# Patient Record
Sex: Female | Born: 1955
Health system: Southern US, Community
[De-identification: ages and names within clinical notes are randomized; demographics above are authoritative.]

---

## 2000-05-01 ENCOUNTER — Other Ambulatory Visit: Admission: RE | Admit: 2000-05-01 | Discharge: 2000-05-01 | Payer: Self-pay | Admitting: Obstetrics and Gynecology

## 2000-10-16 ENCOUNTER — Other Ambulatory Visit: Admission: RE | Admit: 2000-10-16 | Discharge: 2000-10-16 | Payer: Self-pay | Admitting: Obstetrics and Gynecology

## 2001-09-25 ENCOUNTER — Other Ambulatory Visit: Admission: RE | Admit: 2001-09-25 | Discharge: 2001-09-25 | Payer: Self-pay | Admitting: Obstetrics and Gynecology

## 2002-11-15 ENCOUNTER — Other Ambulatory Visit: Admission: RE | Admit: 2002-11-15 | Discharge: 2002-11-15 | Payer: Self-pay | Admitting: Obstetrics and Gynecology

## 2003-04-21 ENCOUNTER — Encounter: Payer: Self-pay | Admitting: Obstetrics and Gynecology

## 2003-04-21 ENCOUNTER — Encounter: Admission: RE | Admit: 2003-04-21 | Discharge: 2003-04-21 | Payer: Self-pay | Admitting: Obstetrics and Gynecology

## 2004-02-06 ENCOUNTER — Other Ambulatory Visit: Admission: RE | Admit: 2004-02-06 | Discharge: 2004-02-06 | Payer: Self-pay | Admitting: Obstetrics and Gynecology

## 2004-02-25 ENCOUNTER — Encounter: Admission: RE | Admit: 2004-02-25 | Discharge: 2004-02-25 | Payer: Self-pay | Admitting: Obstetrics and Gynecology

## 2004-10-18 ENCOUNTER — Encounter: Admission: RE | Admit: 2004-10-18 | Discharge: 2004-10-18 | Payer: Self-pay | Admitting: Family Medicine

## 2005-05-04 ENCOUNTER — Other Ambulatory Visit: Admission: RE | Admit: 2005-05-04 | Discharge: 2005-05-04 | Payer: Self-pay | Admitting: Obstetrics and Gynecology

## 2005-05-06 ENCOUNTER — Encounter: Admission: RE | Admit: 2005-05-06 | Discharge: 2005-05-06 | Payer: Self-pay | Admitting: Obstetrics and Gynecology

## 2006-05-08 ENCOUNTER — Encounter: Admission: RE | Admit: 2006-05-08 | Discharge: 2006-05-08 | Payer: Self-pay | Admitting: Obstetrics and Gynecology

## 2006-06-09 ENCOUNTER — Other Ambulatory Visit: Admission: RE | Admit: 2006-06-09 | Discharge: 2006-06-09 | Payer: Self-pay | Admitting: Obstetrics and Gynecology

## 2006-10-30 ENCOUNTER — Encounter: Admission: RE | Admit: 2006-10-30 | Discharge: 2006-10-30 | Payer: Self-pay | Admitting: Family Medicine

## 2007-05-10 ENCOUNTER — Encounter: Admission: RE | Admit: 2007-05-10 | Discharge: 2007-05-10 | Payer: Self-pay | Admitting: Obstetrics and Gynecology

## 2008-06-05 ENCOUNTER — Encounter: Admission: RE | Admit: 2008-06-05 | Discharge: 2008-06-05 | Payer: Self-pay | Admitting: Family Medicine

## 2009-06-11 ENCOUNTER — Encounter: Admission: RE | Admit: 2009-06-11 | Discharge: 2009-06-11 | Payer: Self-pay | Admitting: Obstetrics and Gynecology

## 2010-06-15 ENCOUNTER — Encounter: Payer: Self-pay | Admitting: Sports Medicine

## 2010-08-17 ENCOUNTER — Encounter: Admission: RE | Admit: 2010-08-17 | Discharge: 2010-08-17 | Payer: Self-pay | Admitting: Family Medicine

## 2011-01-25 NOTE — Consult Note (Signed)
Summary: [facility]   Imported By: Marily Memos 06/15/2010 12:05:14  _____________________________________________________________________  External Attachment:    Type:   Image     Comment:   External Document

## 2011-09-01 ENCOUNTER — Other Ambulatory Visit: Payer: Self-pay | Admitting: Family Medicine

## 2011-09-01 DIAGNOSIS — Z1231 Encounter for screening mammogram for malignant neoplasm of breast: Secondary | ICD-10-CM

## 2011-09-22 ENCOUNTER — Ambulatory Visit
Admission: RE | Admit: 2011-09-22 | Discharge: 2011-09-22 | Disposition: A | Discharge status: Home / Self Care | Payer: 59 | Source: Ambulatory Visit | Attending: Family Medicine | Admitting: Family Medicine

## 2011-09-22 DIAGNOSIS — Z1231 Encounter for screening mammogram for malignant neoplasm of breast: Secondary | ICD-10-CM

## 2012-02-01 ENCOUNTER — Other Ambulatory Visit: Payer: Self-pay | Admitting: Family Medicine

## 2012-02-01 ENCOUNTER — Other Ambulatory Visit (HOSPITAL_COMMUNITY)
Admission: RE | Admit: 2012-02-01 | Discharge: 2012-02-01 | Disposition: A | Discharge status: Home / Self Care | Payer: 59 | Source: Ambulatory Visit | Attending: Family Medicine | Admitting: Family Medicine

## 2012-02-01 DIAGNOSIS — Z124 Encounter for screening for malignant neoplasm of cervix: Secondary | ICD-10-CM | POA: Insufficient documentation

## 2012-09-19 ENCOUNTER — Other Ambulatory Visit: Payer: Self-pay | Admitting: Family Medicine

## 2012-09-19 DIAGNOSIS — Z1231 Encounter for screening mammogram for malignant neoplasm of breast: Secondary | ICD-10-CM

## 2012-10-08 ENCOUNTER — Ambulatory Visit
Admission: RE | Admit: 2012-10-08 | Discharge: 2012-10-08 | Disposition: A | Discharge status: Home / Self Care | Payer: 59 | Source: Ambulatory Visit | Attending: Family Medicine | Admitting: Family Medicine

## 2012-10-08 DIAGNOSIS — Z1231 Encounter for screening mammogram for malignant neoplasm of breast: Secondary | ICD-10-CM

## 2013-05-01 ENCOUNTER — Other Ambulatory Visit: Payer: Self-pay | Admitting: Family Medicine

## 2013-05-01 ENCOUNTER — Ambulatory Visit
Admission: RE | Admit: 2013-05-01 | Discharge: 2013-05-01 | Disposition: A | Discharge status: Home / Self Care | Payer: 59 | Source: Ambulatory Visit | Attending: Family Medicine | Admitting: Family Medicine

## 2013-05-01 DIAGNOSIS — R109 Unspecified abdominal pain: Secondary | ICD-10-CM

## 2013-05-03 ENCOUNTER — Other Ambulatory Visit: Payer: Self-pay | Admitting: Family Medicine

## 2013-05-03 ENCOUNTER — Ambulatory Visit
Admission: RE | Admit: 2013-05-03 | Discharge: 2013-05-03 | Disposition: A | Discharge status: Home / Self Care | Payer: 59 | Source: Ambulatory Visit | Attending: Family Medicine | Admitting: Family Medicine

## 2013-05-03 DIAGNOSIS — K859 Acute pancreatitis without necrosis or infection, unspecified: Secondary | ICD-10-CM

## 2013-05-03 MED ORDER — IOHEXOL 300 MG/ML  SOLN
100.0000 mL | Freq: Once | INTRAMUSCULAR | Status: AC | PRN
  Administered 2013-05-03: 100 mL via INTRAVENOUS

## 2013-06-10 ENCOUNTER — Other Ambulatory Visit: Payer: Self-pay | Admitting: Family Medicine

## 2013-06-10 DIAGNOSIS — R1012 Left upper quadrant pain: Secondary | ICD-10-CM

## 2013-07-04 ENCOUNTER — Ambulatory Visit
Admission: RE | Admit: 2013-07-04 | Discharge: 2013-07-04 | Disposition: A | Discharge status: Home / Self Care | Payer: 59 | Source: Ambulatory Visit | Attending: Family Medicine | Admitting: Family Medicine

## 2013-07-04 DIAGNOSIS — R1012 Left upper quadrant pain: Secondary | ICD-10-CM

## 2013-07-04 MED ORDER — IOHEXOL 300 MG/ML  SOLN
100.0000 mL | Freq: Once | INTRAMUSCULAR | Status: AC | PRN
  Administered 2013-07-04: 100 mL via INTRAVENOUS

## 2013-09-23 ENCOUNTER — Other Ambulatory Visit: Payer: Self-pay

## 2013-09-23 DIAGNOSIS — Z1231 Encounter for screening mammogram for malignant neoplasm of breast: Secondary | ICD-10-CM

## 2013-10-11 ENCOUNTER — Ambulatory Visit
Admission: RE | Admit: 2013-10-11 | Discharge: 2013-10-11 | Disposition: A | Discharge status: Home / Self Care | Payer: 59 | Source: Ambulatory Visit

## 2013-10-11 DIAGNOSIS — Z1231 Encounter for screening mammogram for malignant neoplasm of breast: Secondary | ICD-10-CM

## 2014-05-29 ENCOUNTER — Other Ambulatory Visit: Payer: Self-pay

## 2014-09-16 ENCOUNTER — Other Ambulatory Visit: Payer: Self-pay

## 2014-09-16 DIAGNOSIS — Z1231 Encounter for screening mammogram for malignant neoplasm of breast: Secondary | ICD-10-CM

## 2014-10-13 ENCOUNTER — Ambulatory Visit
Admission: RE | Admit: 2014-10-13 | Discharge: 2014-10-13 | Disposition: A | Discharge status: Home / Self Care | Payer: 59 | Source: Ambulatory Visit

## 2014-10-13 DIAGNOSIS — Z1231 Encounter for screening mammogram for malignant neoplasm of breast: Secondary | ICD-10-CM

## 2015-09-25 ENCOUNTER — Other Ambulatory Visit: Payer: Self-pay

## 2015-09-25 DIAGNOSIS — Z1231 Encounter for screening mammogram for malignant neoplasm of breast: Secondary | ICD-10-CM

## 2015-10-16 ENCOUNTER — Ambulatory Visit
Admission: RE | Admit: 2015-10-16 | Discharge: 2015-10-16 | Disposition: A | Discharge status: Home / Self Care | Payer: 59 | Source: Ambulatory Visit

## 2015-10-16 DIAGNOSIS — Z1231 Encounter for screening mammogram for malignant neoplasm of breast: Secondary | ICD-10-CM

## 2015-11-26 ENCOUNTER — Other Ambulatory Visit (HOSPITAL_COMMUNITY)
Admission: RE | Admit: 2015-11-26 | Discharge: 2015-11-26 | Disposition: A | Discharge status: Home / Self Care | Payer: 59 | Source: Ambulatory Visit | Attending: Family Medicine | Admitting: Family Medicine

## 2015-11-26 ENCOUNTER — Other Ambulatory Visit: Payer: Self-pay | Admitting: Family Medicine

## 2015-11-26 DIAGNOSIS — Z124 Encounter for screening for malignant neoplasm of cervix: Secondary | ICD-10-CM | POA: Diagnosis present

## 2015-11-26 DIAGNOSIS — Z1151 Encounter for screening for human papillomavirus (HPV): Secondary | ICD-10-CM | POA: Diagnosis not present

## 2015-11-27 LAB — CYTOLOGY - PAP

## 2016-09-16 ENCOUNTER — Other Ambulatory Visit: Payer: Self-pay | Admitting: Family Medicine

## 2016-09-16 DIAGNOSIS — Z1231 Encounter for screening mammogram for malignant neoplasm of breast: Secondary | ICD-10-CM

## 2016-10-21 ENCOUNTER — Ambulatory Visit: Payer: 59

## 2016-10-26 ENCOUNTER — Ambulatory Visit
Admission: RE | Admit: 2016-10-26 | Discharge: 2016-10-26 | Disposition: A | Discharge status: Home / Self Care | Payer: 59 | Source: Ambulatory Visit | Attending: Family Medicine | Admitting: Family Medicine

## 2016-10-26 DIAGNOSIS — Z1231 Encounter for screening mammogram for malignant neoplasm of breast: Secondary | ICD-10-CM

## 2017-01-09 DIAGNOSIS — M8588 Other specified disorders of bone density and structure, other site: Secondary | ICD-10-CM | POA: Diagnosis not present

## 2017-01-09 DIAGNOSIS — Z13820 Encounter for screening for osteoporosis: Secondary | ICD-10-CM | POA: Diagnosis not present

## 2017-01-26 DIAGNOSIS — Z1211 Encounter for screening for malignant neoplasm of colon: Secondary | ICD-10-CM | POA: Diagnosis not present

## 2017-01-26 DIAGNOSIS — D126 Benign neoplasm of colon, unspecified: Secondary | ICD-10-CM | POA: Diagnosis not present

## 2017-01-26 DIAGNOSIS — K635 Polyp of colon: Secondary | ICD-10-CM | POA: Diagnosis not present

## 2017-03-21 ENCOUNTER — Other Ambulatory Visit: Payer: Self-pay | Admitting: Family Medicine

## 2017-03-21 DIAGNOSIS — L989 Disorder of the skin and subcutaneous tissue, unspecified: Secondary | ICD-10-CM | POA: Diagnosis not present

## 2017-03-21 DIAGNOSIS — L820 Inflamed seborrheic keratosis: Secondary | ICD-10-CM | POA: Diagnosis not present

## 2017-08-21 DIAGNOSIS — L0390 Cellulitis, unspecified: Secondary | ICD-10-CM | POA: Diagnosis not present

## 2017-11-06 DIAGNOSIS — R413 Other amnesia: Secondary | ICD-10-CM | POA: Diagnosis not present

## 2017-11-06 DIAGNOSIS — L0390 Cellulitis, unspecified: Secondary | ICD-10-CM | POA: Diagnosis not present

## 2017-11-07 ENCOUNTER — Other Ambulatory Visit: Payer: Self-pay | Admitting: Physician Assistant

## 2017-11-07 DIAGNOSIS — R413 Other amnesia: Secondary | ICD-10-CM | POA: Diagnosis not present

## 2017-11-07 DIAGNOSIS — R51 Headache: Principal | ICD-10-CM

## 2017-11-07 DIAGNOSIS — R519 Headache, unspecified: Secondary | ICD-10-CM

## 2017-11-07 DIAGNOSIS — Z23 Encounter for immunization: Secondary | ICD-10-CM | POA: Diagnosis not present

## 2017-11-09 ENCOUNTER — Ambulatory Visit
Admission: RE | Admit: 2017-11-09 | Discharge: 2017-11-09 | Disposition: A | Discharge status: Home / Self Care | Payer: 59 | Source: Ambulatory Visit | Attending: Physician Assistant | Admitting: Physician Assistant

## 2017-11-09 DIAGNOSIS — R51 Headache: Principal | ICD-10-CM

## 2017-11-09 DIAGNOSIS — R519 Headache, unspecified: Secondary | ICD-10-CM

## 2017-12-27 ENCOUNTER — Other Ambulatory Visit: Payer: Self-pay | Admitting: Family Medicine

## 2017-12-27 DIAGNOSIS — Z1231 Encounter for screening mammogram for malignant neoplasm of breast: Secondary | ICD-10-CM

## 2018-01-17 ENCOUNTER — Ambulatory Visit: Payer: 59

## 2018-01-30 ENCOUNTER — Other Ambulatory Visit: Payer: Self-pay | Admitting: Family Medicine

## 2018-01-30 ENCOUNTER — Other Ambulatory Visit (HOSPITAL_COMMUNITY)
Admission: RE | Admit: 2018-01-30 | Discharge: 2018-01-30 | Disposition: A | Discharge status: Home / Self Care | Payer: 59 | Source: Ambulatory Visit | Attending: Family Medicine | Admitting: Family Medicine

## 2018-01-30 DIAGNOSIS — Z124 Encounter for screening for malignant neoplasm of cervix: Secondary | ICD-10-CM | POA: Insufficient documentation

## 2018-01-30 DIAGNOSIS — E782 Mixed hyperlipidemia: Secondary | ICD-10-CM | POA: Diagnosis not present

## 2018-01-30 DIAGNOSIS — E039 Hypothyroidism, unspecified: Secondary | ICD-10-CM | POA: Diagnosis not present

## 2018-01-30 DIAGNOSIS — Z0000 Encounter for general adult medical examination without abnormal findings: Secondary | ICD-10-CM | POA: Diagnosis not present

## 2018-02-01 LAB — CYTOLOGY - PAP
DIAGNOSIS: NEGATIVE
HPV (WINDOPATH): NOT DETECTED

## 2018-02-19 ENCOUNTER — Ambulatory Visit
Admission: RE | Admit: 2018-02-19 | Discharge: 2018-02-19 | Disposition: A | Discharge status: Home / Self Care | Payer: 59 | Source: Ambulatory Visit | Attending: Family Medicine | Admitting: Family Medicine

## 2018-02-19 DIAGNOSIS — Z1231 Encounter for screening mammogram for malignant neoplasm of breast: Secondary | ICD-10-CM | POA: Diagnosis not present

## 2018-03-30 DIAGNOSIS — E039 Hypothyroidism, unspecified: Secondary | ICD-10-CM | POA: Diagnosis not present

## 2018-08-20 DIAGNOSIS — J209 Acute bronchitis, unspecified: Secondary | ICD-10-CM | POA: Diagnosis not present

## 2018-08-20 DIAGNOSIS — J9801 Acute bronchospasm: Secondary | ICD-10-CM | POA: Diagnosis not present

## 2018-08-26 DIAGNOSIS — J209 Acute bronchitis, unspecified: Secondary | ICD-10-CM | POA: Diagnosis not present

## 2018-09-17 DIAGNOSIS — Z23 Encounter for immunization: Secondary | ICD-10-CM | POA: Diagnosis not present

## 2018-09-28 ENCOUNTER — Ambulatory Visit
Admission: RE | Admit: 2018-09-28 | Discharge: 2018-09-28 | Disposition: A | Discharge status: Home / Self Care | Payer: 59 | Source: Ambulatory Visit | Attending: Family Medicine | Admitting: Family Medicine

## 2018-09-28 ENCOUNTER — Other Ambulatory Visit: Payer: Self-pay | Admitting: Family Medicine

## 2018-09-28 DIAGNOSIS — J209 Acute bronchitis, unspecified: Secondary | ICD-10-CM | POA: Diagnosis not present

## 2018-09-28 DIAGNOSIS — J45909 Unspecified asthma, uncomplicated: Secondary | ICD-10-CM | POA: Diagnosis not present

## 2018-09-28 DIAGNOSIS — R05 Cough: Secondary | ICD-10-CM | POA: Diagnosis not present

## 2018-10-12 DIAGNOSIS — J45909 Unspecified asthma, uncomplicated: Secondary | ICD-10-CM | POA: Diagnosis not present

## 2019-01-25 ENCOUNTER — Other Ambulatory Visit: Payer: Self-pay | Admitting: Family Medicine

## 2019-01-25 DIAGNOSIS — Z1231 Encounter for screening mammogram for malignant neoplasm of breast: Secondary | ICD-10-CM

## 2019-02-21 ENCOUNTER — Ambulatory Visit: Payer: 59

## 2019-02-25 ENCOUNTER — Other Ambulatory Visit: Payer: Self-pay | Admitting: Family Medicine

## 2019-02-25 DIAGNOSIS — E782 Mixed hyperlipidemia: Secondary | ICD-10-CM | POA: Diagnosis not present

## 2019-02-25 DIAGNOSIS — Z0000 Encounter for general adult medical examination without abnormal findings: Secondary | ICD-10-CM | POA: Diagnosis not present

## 2019-02-25 DIAGNOSIS — Z23 Encounter for immunization: Secondary | ICD-10-CM | POA: Diagnosis not present

## 2019-02-25 DIAGNOSIS — F17210 Nicotine dependence, cigarettes, uncomplicated: Secondary | ICD-10-CM | POA: Diagnosis not present

## 2019-02-25 DIAGNOSIS — E039 Hypothyroidism, unspecified: Secondary | ICD-10-CM | POA: Diagnosis not present

## 2019-02-25 DIAGNOSIS — M8580 Other specified disorders of bone density and structure, unspecified site: Secondary | ICD-10-CM

## 2019-04-04 ENCOUNTER — Other Ambulatory Visit: Payer: Self-pay

## 2019-04-04 ENCOUNTER — Ambulatory Visit: Payer: Self-pay

## 2019-04-29 DIAGNOSIS — Z23 Encounter for immunization: Secondary | ICD-10-CM | POA: Diagnosis not present

## 2019-05-28 ENCOUNTER — Ambulatory Visit: Payer: Self-pay

## 2019-05-28 ENCOUNTER — Other Ambulatory Visit: Payer: Self-pay

## 2019-05-31 DIAGNOSIS — E7800 Pure hypercholesterolemia, unspecified: Secondary | ICD-10-CM | POA: Diagnosis not present

## 2019-05-31 DIAGNOSIS — S61210A Laceration without foreign body of right index finger without damage to nail, initial encounter: Secondary | ICD-10-CM | POA: Diagnosis not present

## 2019-05-31 DIAGNOSIS — S61214A Laceration without foreign body of right ring finger without damage to nail, initial encounter: Secondary | ICD-10-CM | POA: Diagnosis not present

## 2019-05-31 DIAGNOSIS — S67190A Crushing injury of right index finger, initial encounter: Secondary | ICD-10-CM | POA: Diagnosis not present

## 2019-05-31 DIAGNOSIS — E039 Hypothyroidism, unspecified: Secondary | ICD-10-CM | POA: Diagnosis not present

## 2019-05-31 DIAGNOSIS — F17210 Nicotine dependence, cigarettes, uncomplicated: Secondary | ICD-10-CM | POA: Diagnosis not present

## 2019-08-09 ENCOUNTER — Ambulatory Visit: Payer: Self-pay

## 2019-08-09 ENCOUNTER — Other Ambulatory Visit: Payer: Self-pay

## 2019-10-07 DIAGNOSIS — J0100 Acute maxillary sinusitis, unspecified: Secondary | ICD-10-CM | POA: Diagnosis not present

## 2019-10-19 DIAGNOSIS — J209 Acute bronchitis, unspecified: Secondary | ICD-10-CM | POA: Diagnosis not present

## 2020-01-08 ENCOUNTER — Other Ambulatory Visit: Payer: Self-pay | Admitting: Family Medicine

## 2020-01-08 DIAGNOSIS — M8580 Other specified disorders of bone density and structure, unspecified site: Secondary | ICD-10-CM

## 2020-01-08 DIAGNOSIS — Z1231 Encounter for screening mammogram for malignant neoplasm of breast: Secondary | ICD-10-CM

## 2020-03-06 ENCOUNTER — Ambulatory Visit
Admission: RE | Admit: 2020-03-06 | Discharge: 2020-03-06 | Disposition: A | Discharge status: Home / Self Care | Payer: BC Managed Care – PPO | Source: Ambulatory Visit | Attending: Family Medicine | Admitting: Family Medicine

## 2020-03-06 ENCOUNTER — Other Ambulatory Visit: Payer: Self-pay

## 2020-03-06 DIAGNOSIS — E039 Hypothyroidism, unspecified: Secondary | ICD-10-CM | POA: Diagnosis not present

## 2020-03-06 DIAGNOSIS — E782 Mixed hyperlipidemia: Secondary | ICD-10-CM | POA: Diagnosis not present

## 2020-03-06 DIAGNOSIS — Z1231 Encounter for screening mammogram for malignant neoplasm of breast: Secondary | ICD-10-CM | POA: Diagnosis not present

## 2020-03-06 DIAGNOSIS — Z780 Asymptomatic menopausal state: Secondary | ICD-10-CM | POA: Diagnosis not present

## 2020-03-06 DIAGNOSIS — M8580 Other specified disorders of bone density and structure, unspecified site: Secondary | ICD-10-CM

## 2020-03-06 DIAGNOSIS — Z0000 Encounter for general adult medical examination without abnormal findings: Secondary | ICD-10-CM | POA: Diagnosis not present

## 2020-03-06 DIAGNOSIS — M8589 Other specified disorders of bone density and structure, multiple sites: Secondary | ICD-10-CM | POA: Diagnosis not present

## 2020-03-06 DIAGNOSIS — F17200 Nicotine dependence, unspecified, uncomplicated: Secondary | ICD-10-CM | POA: Diagnosis not present

## 2020-03-19 ENCOUNTER — Telehealth: Payer: Self-pay | Admitting: *Deleted

## 2020-03-23 NOTE — Telephone Encounter (Signed)
Spoke with pt regarding lung cancer screening. Pt has decided she would like to wait until she goes on Medicare in 12/2020 to start. I advised pt that if she changes her mind and would like to start sooner to call me and we can get her set up. Pt verbalized understanding. Will defer pt's referral and call her back close to 12/2020.

## 2020-05-04 IMAGING — MG DIGITAL SCREENING BILAT W/ CAD
4 series · 4 of 4 positions shown · non-contrast
Comparison: Previous exam(s).

CLINICAL DATA: Screening.

EXAM:
DIGITAL SCREENING BILATERAL MAMMOGRAM WITH CAD

[L MLO]
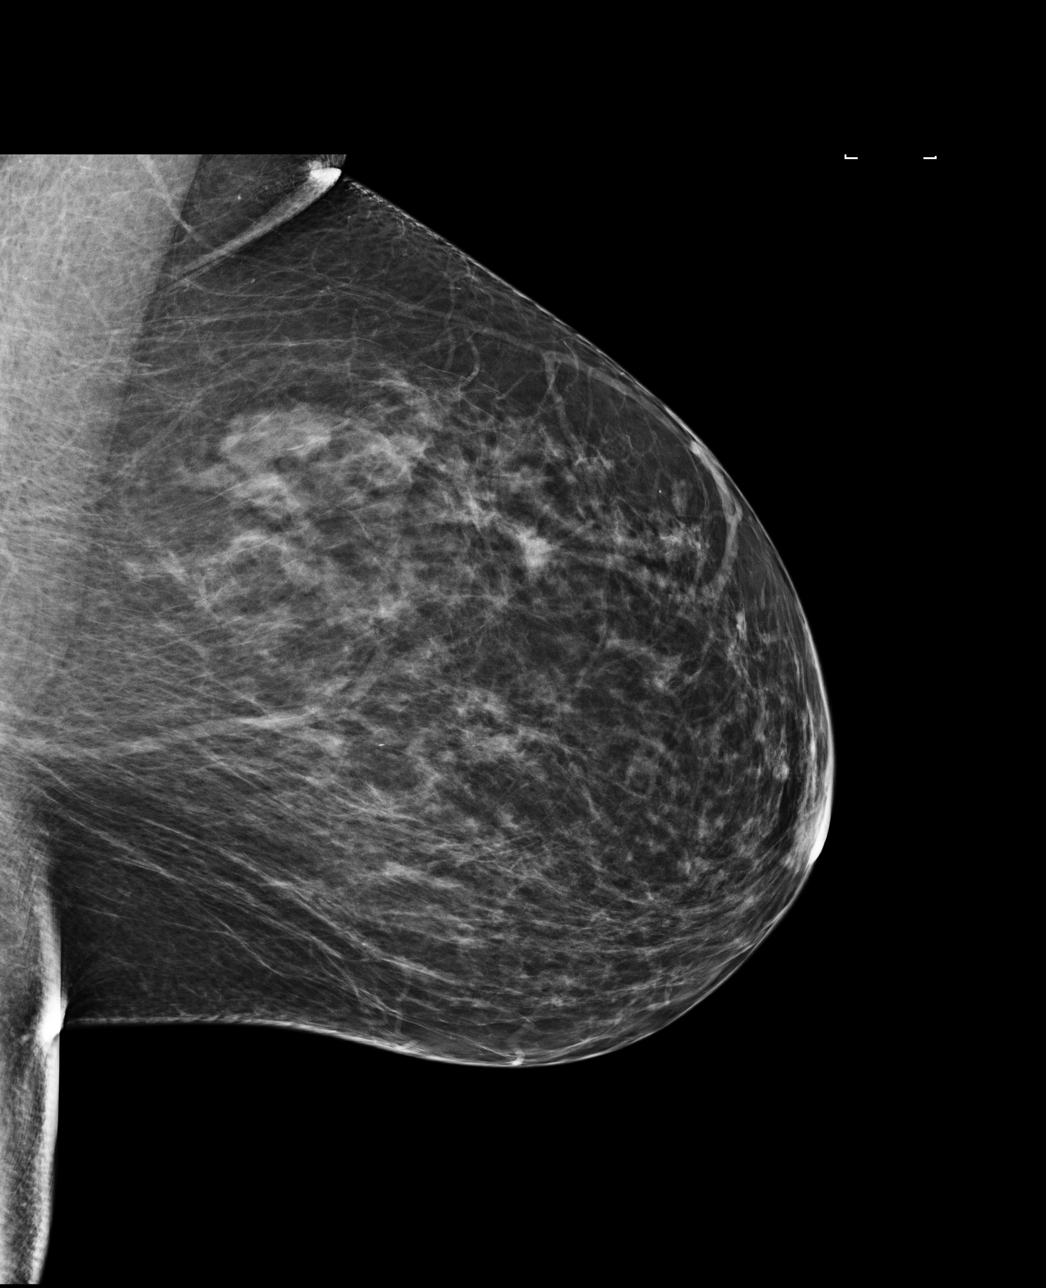

[R CC]
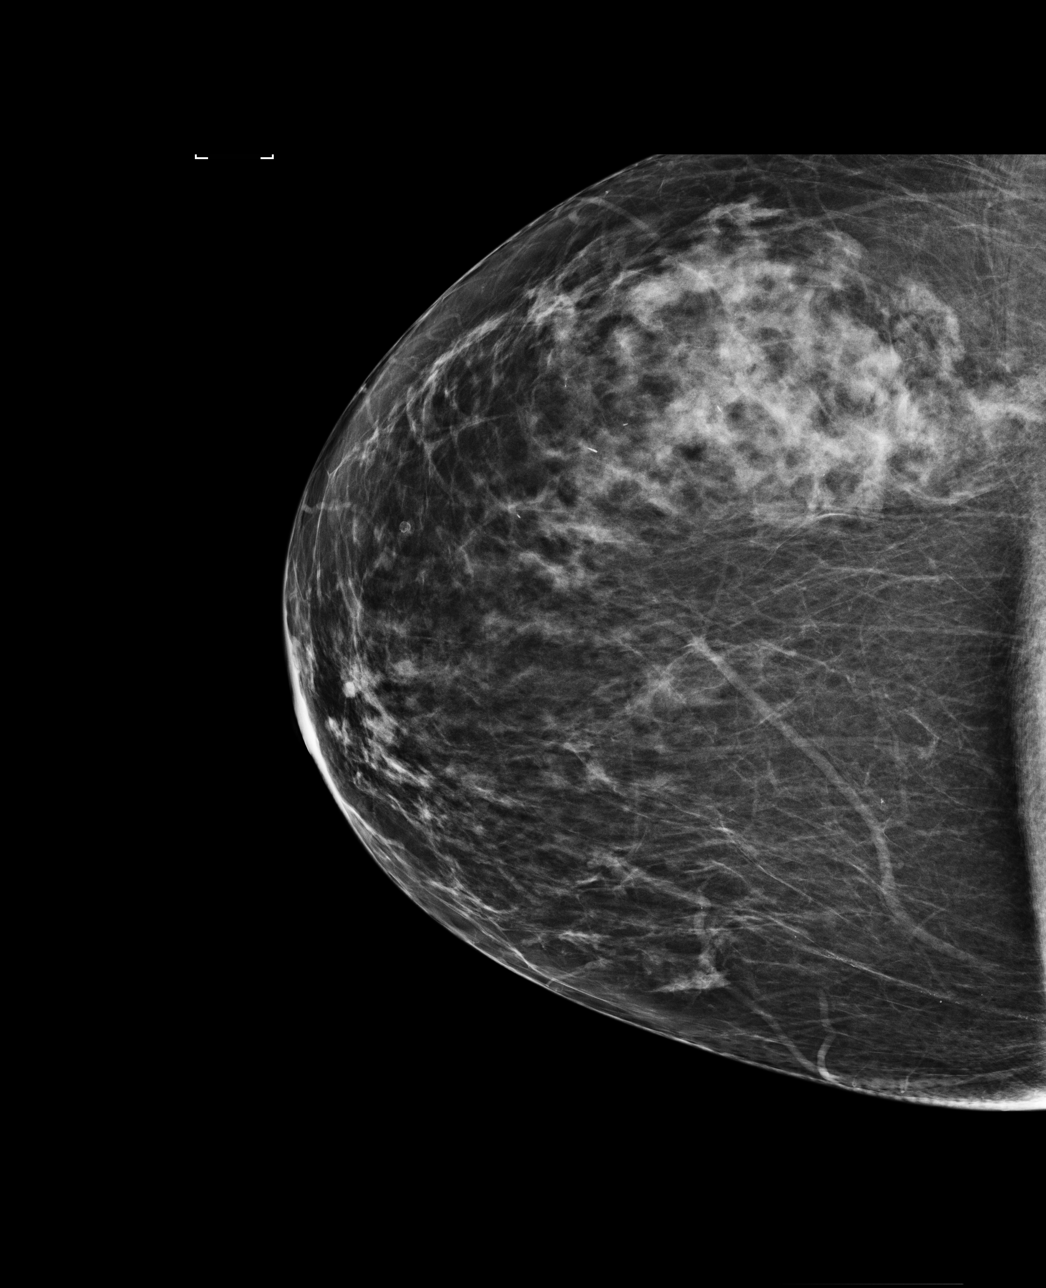

[L CC]
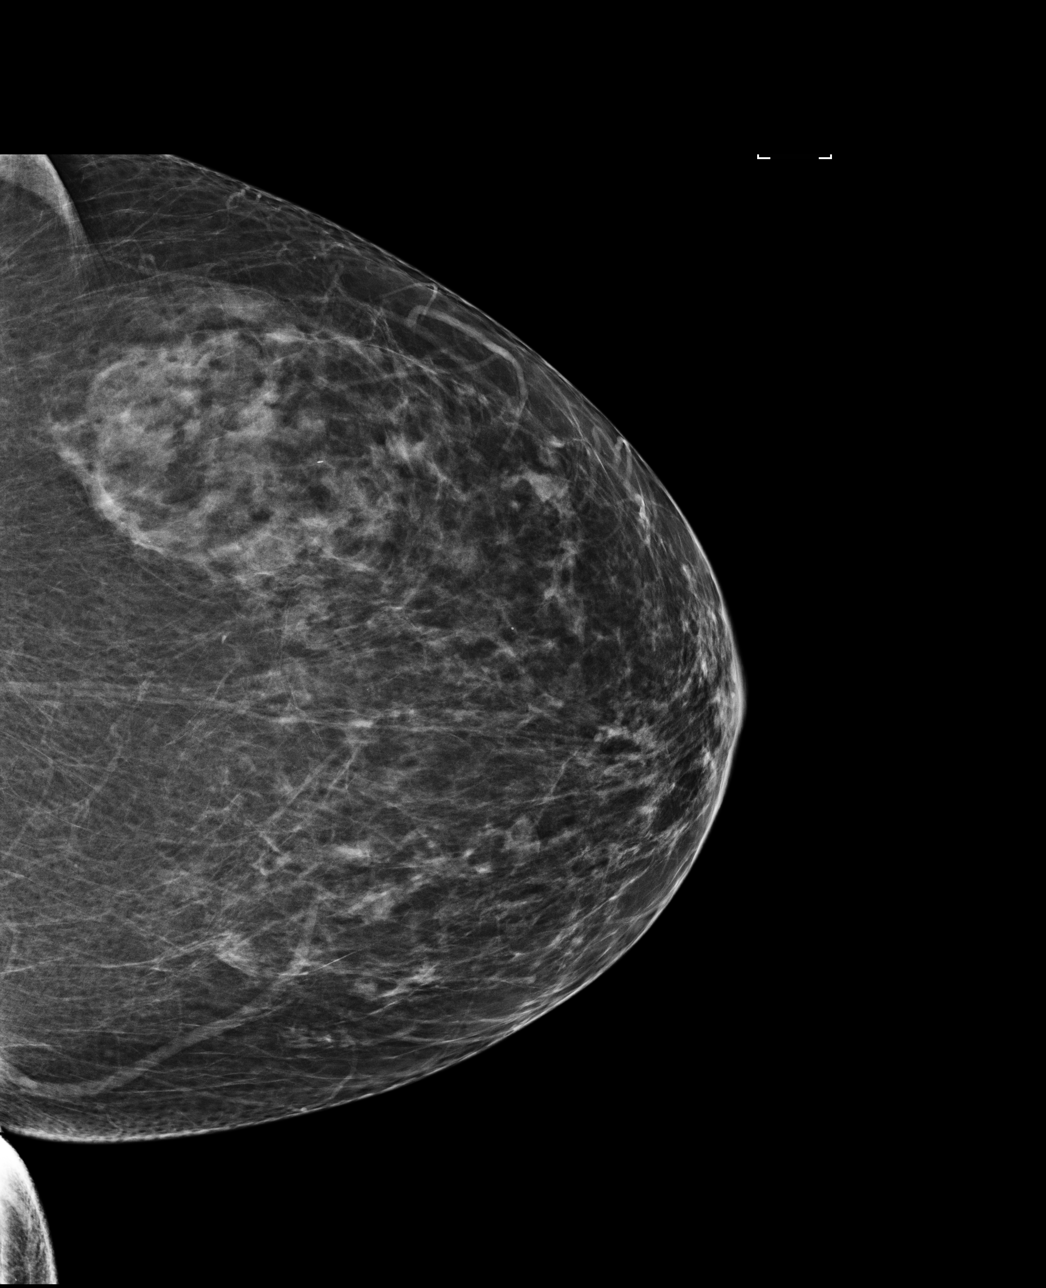

[R MLO]
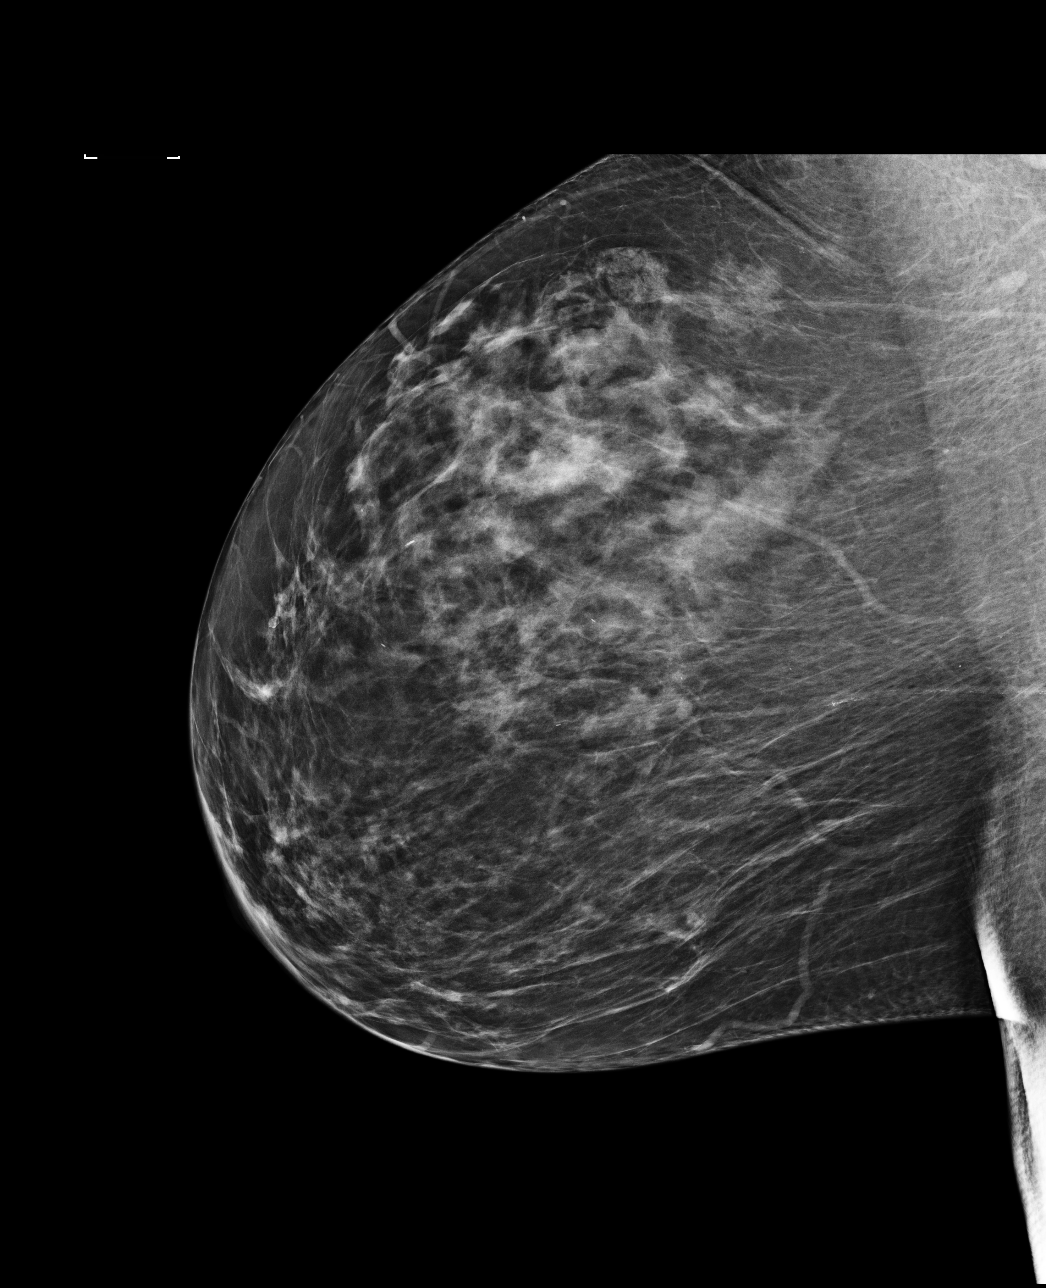

[4 of 4 positions shown; findings below may reference images not displayed]

ACR Breast Density Category c: The breast tissue is heterogeneously
dense, which may obscure small masses.
FINDINGS: There are no findings suspicious for malignancy. Images were
processed with CAD.
IMPRESSION: No mammographic evidence of malignancy. A result letter of this
screening mammogram will be mailed directly to the patient.

RECOMMENDATION:
Screening mammogram in one year. (Code:[id number])

BI-RADS CATEGORY  1: Negative.

## 2020-08-01 DIAGNOSIS — H10029 Other mucopurulent conjunctivitis, unspecified eye: Secondary | ICD-10-CM | POA: Diagnosis not present

## 2020-09-27 DIAGNOSIS — J189 Pneumonia, unspecified organism: Secondary | ICD-10-CM | POA: Diagnosis not present

## 2020-09-27 DIAGNOSIS — F17200 Nicotine dependence, unspecified, uncomplicated: Secondary | ICD-10-CM | POA: Diagnosis not present

## 2020-09-27 DIAGNOSIS — J449 Chronic obstructive pulmonary disease, unspecified: Secondary | ICD-10-CM | POA: Diagnosis not present

## 2020-09-27 DIAGNOSIS — Z20828 Contact with and (suspected) exposure to other viral communicable diseases: Secondary | ICD-10-CM | POA: Diagnosis not present

## 2021-03-10 ENCOUNTER — Other Ambulatory Visit: Payer: Self-pay | Admitting: *Deleted

## 2021-03-10 DIAGNOSIS — Z87891 Personal history of nicotine dependence: Secondary | ICD-10-CM

## 2021-03-10 DIAGNOSIS — F17210 Nicotine dependence, cigarettes, uncomplicated: Secondary | ICD-10-CM

## 2021-03-18 DIAGNOSIS — E782 Mixed hyperlipidemia: Secondary | ICD-10-CM | POA: Diagnosis not present

## 2021-03-18 DIAGNOSIS — E039 Hypothyroidism, unspecified: Secondary | ICD-10-CM | POA: Diagnosis not present

## 2021-03-18 DIAGNOSIS — Z1159 Encounter for screening for other viral diseases: Secondary | ICD-10-CM | POA: Diagnosis not present

## 2021-03-18 DIAGNOSIS — Z0000 Encounter for general adult medical examination without abnormal findings: Secondary | ICD-10-CM | POA: Diagnosis not present

## 2021-03-18 DIAGNOSIS — M8580 Other specified disorders of bone density and structure, unspecified site: Secondary | ICD-10-CM | POA: Diagnosis not present

## 2021-04-14 ENCOUNTER — Encounter: Payer: Self-pay | Admitting: Acute Care

## 2021-04-14 ENCOUNTER — Other Ambulatory Visit: Payer: Self-pay

## 2021-04-14 ENCOUNTER — Ambulatory Visit (INDEPENDENT_AMBULATORY_CARE_PROVIDER_SITE_OTHER): Payer: Medicare Other | Admitting: Acute Care

## 2021-04-14 DIAGNOSIS — F17210 Nicotine dependence, cigarettes, uncomplicated: Secondary | ICD-10-CM

## 2021-04-14 DIAGNOSIS — Z122 Encounter for screening for malignant neoplasm of respiratory organs: Secondary | ICD-10-CM | POA: Diagnosis not present

## 2021-04-14 NOTE — Patient Instructions (Signed)
Thank you for participating in the [facility] Scre[facility]. It was our pleasure to meet you today. We will call you with the results of your scan within the next few days. Your scan will be assigned a Lung RADS category score by the physicians reading the scans.  This Lung RADS score determines follow up scanning.  See below for description of categories, and follow up screening recommendations. We will be in touch to schedule your follow up screening annually or based on recommendations of our providers. We will fax a copy of your scan results to your Primary Care Physician, or the physician who referred you to the program, to ensure they have the results. Please call the office if you have any questions or concerns regarding your scanning experience or results.  Our office number is [phone]. Please speak with Denise Phelps, RN. She is our Lung Cancer Screening RN. If she is unavailable when you call, please have the office staff send her a message. She will return your call at her earliest convenience. Remember, if your scan is normal, we will scan you annually as long as you continue to meet the criteria for the program. (Age 55-77, Current smoker or smoker who has quit within the last 15 years). If you are a smoker, remember, quitting is the single most powerful action that you can take to decrease your risk of lung cancer and other pulmonary, breathing related problems. We know quitting is hard, and we are here to help.  Please let us know if there is anything we can do to help you meet your goal of quitting. If you are a former smoker, congratulations. We are proud of you! Remain smoke free! Remember you can refer friends or family members through the number above.  We will screen them to make sure they meet criteria for the program. Thank you for helping us take better care of you by participating in Lung Screening.  Lung RADS Categories:  Lung RADS 1: no nodules  or definitely non-concerning nodules.  Recommendation is for a repeat annual scan in 12 months.  Lung RADS 2:  nodules that are non-concerning in appearance and behavior with a very low likelihood of becoming an active cancer. Recommendation is for a repeat annual scan in 12 months.  Lung RADS 3: nodules that are probably non-concerning , includes nodules with a low likelihood of becoming an active cancer.  Recommendation is for a 6-month repeat screening scan. Often noted after an upper respiratory illness. We will be in touch to make sure you have no questions, and to schedule your 6-month scan.  Lung RADS 4 A: nodules with concerning findings, recommendation is most often for a follow up scan in 3 months or additional testing based on our provider's assessment of the scan. We will be in touch to make sure you have no questions and to schedule the recommended 3 month follow up scan.  Lung RADS 4 B:  indicates findings that are concerning. We will be in touch with you to schedule additional diagnostic testing based on our provider's  assessment of the scan.   

## 2021-04-14 NOTE — Progress Notes (Signed)
Virtual Visit via Video Note  I connected with Kimberly Jennings on 04/14/21 at 11:00 AM EDT by a video enabled telemedicine application and verified that I am speaking with the correct person using two identifiers.  Location: Patient: At home Provider: [place], [place], [place], [address], Pulmonary Critical Care Office   I discussed the limitations of evaluation and management by telemedicine and the availability of in person appointments. The patient expressed understanding and agreed to proceed.    Shared Decision Making Visit Lung Cancer Screening Program [phone])   Eligibility:  Age 65 y.o.  Pack Years Smoking History Calculation 36 pack year smoking history (# packs/per year x # years smoked)  Recent History of coughing up blood  no  Unexplained weight loss? no ( >Than 15 pounds within the last 6 months )  Prior History Lung / other cancer no (Diagnosis within the last 5 years already requiring surveillance chest CT Scans).  Smoking Status Current Smoker  Former Smokers: Years since quit: NA  Quit Date: NA  Visit Components:  Discussion included one or more decision making aids. yes  Discussion included risk/benefits of screening. yes  Discussion included potential follow up diagnostic testing for abnormal scans. yes  Discussion included meaning and risk of over diagnosis. yes  Discussion included meaning and risk of False Positives. yes  Discussion included meaning of total radiation exposure. yes  Counseling Included:  Importance of adherence to annual lung cancer LDCT screening. yes  Impact of comorbidities on ability to participate in the program. yes  Ability and willingness to under diagnostic treatment. yes  Smoking Cessation Counseling:  Current Smokers:   Discussed importance of smoking cessation. yes  Information about tobacco cessation classes and interventions provided to patient. yes  Patient provided with "ticket" for LDCT  Scan. yes  Symptomatic Patient. no  Counseling NA  Diagnosis Code: Tobacco Use Z72.0  Asymptomatic Patient yes  Counseling (Intermediate counseling: > three minutes counseling) Q7341  Former Smokers:   Discussed the importance of maintaining cigarette abstinence. yes  Diagnosis Code: Personal History of Nicotine Dependence. P37.902  Information about tobacco cessation classes and interventions provided to patient. Yes  Patient provided with "ticket" for LDCT Scan. yes  Written Order for Lung Cancer Screening with LDCT placed in Epic. Yes (CT Chest Lung Cancer Screening Low Dose W/O CM) [id number] Z12.2-Screening of respiratory organs Z87.891-Personal history of nicotine dependence  I have spent 25 minutes of face to face time with Ms. Kimberly Jennings discussing the risks and benefits of lung cancer screening. We viewed a power point together that explained in detail the above noted topics. We paused at intervals to allow for questions to be asked and answered to ensure understanding.We discussed that the single most powerful action that she can take to decrease her risk of developing lung cancer is to quit smoking. We discussed whether or not she is ready to commit to setting a quit date. We discussed options for tools to aid in quitting smoking including nicotine replacement therapy, non-nicotine medications, support groups, Quit Smart classes, and behavior modification. We discussed that often times setting smaller, more achievable goals, such as eliminating 1 cigarette a day for a week and then 2 cigarettes a day for a week can be helpful in slowly decreasing the number of cigarettes smoked. This allows for a sense of accomplishment as well as providing a clinical benefit. I gave her the " Be Stronger Than Your Excuses" card with contact information for community resources, classes,  free nicotine replacement therapy, and access to mobile apps, text messaging, and on-line smoking cessation help. I have  also given her my card and contact information in the event she needs to contact me. We discussed the time and location of the scan, and that either Doroteo Glassman RN or I will call with the results within 24-48 hours of receiving them. I have offered her  a copy of the power point we viewed  as a resource in the event they need reinforcement of the concepts we discussed today in the office. The patient verbalized understanding of all of  the above and had no further questions upon leaving the office. They have my contact information in the event they have any further questions.  I spent 4 minutes counseling on smoking cessation and the health risks of continued tobacco abuse.  I explained to the patient that there has been a high incidence of coronary artery disease noted on these exams. I explained that this is a non-gated exam therefore degree or severity cannot be determined. This patient is currently on statin therapy. I have asked the patient to follow-up with their PCP regarding any incidental finding of coronary artery disease and management with diet or medication as their PCP  feels is clinically indicated. The patient verbalized understanding of the above and had no further questions upon completion of the visit.    Magdalen Spatz, NP 04/14/2021

## 2021-04-21 ENCOUNTER — Telehealth: Payer: Self-pay | Admitting: Acute Care

## 2021-04-21 DIAGNOSIS — Z87891 Personal history of nicotine dependence: Secondary | ICD-10-CM

## 2021-04-21 DIAGNOSIS — F17210 Nicotine dependence, cigarettes, uncomplicated: Secondary | ICD-10-CM

## 2021-04-21 NOTE — Telephone Encounter (Signed)
Please call patient and let them  know their  low dose Ct was read as a. Lung RADS 2: nodules that are benign in appearance and behavior with a very low likelihood of becoming a clinically active cancer due to size or lack of growth. Recommendation per radiology is for a repeat LDCT in 12 months..Please let them  know we will order and schedule their  annual screening scan for 03/2022. Please let them  know there was notation of CAD on their  scan.  Please remind the patient  that this is a non-gated exam therefore degree or severity of disease  cannot be determined. Please have them  follow up with their PCP regarding potential risk factor modification, dietary therapy or pharmacologic therapy if clinically indicated. Pt.  is not  currently on statin therapy. Please place order for annual  screening scan for  03/2022 and fax results to PCP. Thanks so much.  Ask her to follow up with PCP for finding of age advanced coronary artery atherosclerosis.Thanks so much

## 2021-04-22 NOTE — Addendum Note (Signed)
Addended by: Doroteo Glassman D on: 04/22/2021 08:58 AM   Modules accepted: Orders

## 2021-04-22 NOTE — Telephone Encounter (Signed)
Pt informed of CT results per Sarah Groce, NP.  PT verbalized understanding.  Copy sent to PCP.  Order placed for 1 yr f/u CT.  

## 2021-04-26 ENCOUNTER — Other Ambulatory Visit: Payer: Self-pay | Admitting: Podiatry

## 2021-04-26 ENCOUNTER — Other Ambulatory Visit: Payer: Self-pay

## 2021-04-26 ENCOUNTER — Ambulatory Visit (INDEPENDENT_AMBULATORY_CARE_PROVIDER_SITE_OTHER): Payer: Medicare Other

## 2021-04-26 ENCOUNTER — Ambulatory Visit: Payer: Medicare Other | Admitting: Podiatry

## 2021-04-26 DIAGNOSIS — M2012 Hallux valgus (acquired), left foot: Secondary | ICD-10-CM | POA: Diagnosis not present

## 2021-04-26 DIAGNOSIS — F17200 Nicotine dependence, unspecified, uncomplicated: Secondary | ICD-10-CM | POA: Insufficient documentation

## 2021-04-26 DIAGNOSIS — M2042 Other hammer toe(s) (acquired), left foot: Secondary | ICD-10-CM | POA: Diagnosis not present

## 2021-04-26 DIAGNOSIS — M21612 Bunion of left foot: Secondary | ICD-10-CM

## 2021-04-26 DIAGNOSIS — D692 Other nonthrombocytopenic purpura: Secondary | ICD-10-CM | POA: Insufficient documentation

## 2021-04-26 DIAGNOSIS — M8580 Other specified disorders of bone density and structure, unspecified site: Secondary | ICD-10-CM | POA: Insufficient documentation

## 2021-04-26 DIAGNOSIS — M79671 Pain in right foot: Secondary | ICD-10-CM

## 2021-04-26 DIAGNOSIS — E039 Hypothyroidism, unspecified: Secondary | ICD-10-CM | POA: Insufficient documentation

## 2021-04-26 DIAGNOSIS — E669 Obesity, unspecified: Secondary | ICD-10-CM | POA: Insufficient documentation

## 2021-04-26 DIAGNOSIS — M79672 Pain in left foot: Secondary | ICD-10-CM

## 2021-04-26 DIAGNOSIS — K219 Gastro-esophageal reflux disease without esophagitis: Secondary | ICD-10-CM | POA: Insufficient documentation

## 2021-04-26 DIAGNOSIS — J45909 Unspecified asthma, uncomplicated: Secondary | ICD-10-CM | POA: Insufficient documentation

## 2021-04-26 DIAGNOSIS — E7800 Pure hypercholesterolemia, unspecified: Secondary | ICD-10-CM | POA: Insufficient documentation

## 2021-04-26 DIAGNOSIS — R413 Other amnesia: Secondary | ICD-10-CM | POA: Insufficient documentation

## 2021-04-26 DIAGNOSIS — M7752 Other enthesopathy of left foot: Secondary | ICD-10-CM

## 2021-04-26 DIAGNOSIS — J301 Allergic rhinitis due to pollen: Secondary | ICD-10-CM | POA: Insufficient documentation

## 2021-04-26 NOTE — Progress Notes (Signed)
  Subjective:  Patient ID: Kimberly Jennings, female    DOB: June 05, 1956,  MRN: [id number]  Chief Complaint  Patient presents with  . Foot Problem    Lesions at LT hallux lateral side and 2nd to medial side x years -toes rubbs each other -Tx: cotton in between and corn pads -worse with shoes     65 y.o. female presents with the above complaint. History confirmed with patient.   Objective:  Physical Exam: warm, good capillary refill, no trophic changes or ulcerative lesions, normal DP and PT pulses and normal sensory exam. Left Foot: bunion deformity noted and hammertoes, with 2nd PIPJ corn medially, mild HPK lateral hallux. HPK submet 4 . POP about the 1st and 2nd toes and 4th met area.   No images are attached to the encounter.  Radiographs: X-ray of the left foot: hallux valgus deformity and digital contractures mild 1st MPJ arthritic changes, prominent bone medially. Assessment:   1. Hallux valgus with bunions, left   2. Hammer toe of left foot   3. Capsulitis of metatarsophalangeal (MTP) joint of left foot      Plan:  Patient was evaluated and treated and all questions answered.  Bunion and Hammertoe -XR reviewed with patient -Educated on etiology of deformity -Discussed proper shoe gear modifications and padding -Dispensed toe spacers  -Lesions courtesy debrided. -Should issues persist consider MIS bunionectomy, 2nd HT correction, possible 4th met ostoetomy.  Return in about 4 weeks (around 05/24/2021) for Left bunion, hammertoe f/u; possible surgical planning visit.

## 2021-06-07 ENCOUNTER — Ambulatory Visit: Payer: Medicare Other | Admitting: Podiatry

## 2021-10-14 ENCOUNTER — Other Ambulatory Visit: Payer: Self-pay

## 2021-10-14 ENCOUNTER — Ambulatory Visit: Payer: Medicare Other | Admitting: Podiatry

## 2021-10-14 DIAGNOSIS — M7752 Other enthesopathy of left foot: Secondary | ICD-10-CM | POA: Diagnosis not present

## 2021-10-14 DIAGNOSIS — M2042 Other hammer toe(s) (acquired), left foot: Secondary | ICD-10-CM | POA: Diagnosis not present

## 2021-10-14 DIAGNOSIS — M2012 Hallux valgus (acquired), left foot: Secondary | ICD-10-CM

## 2021-10-14 DIAGNOSIS — M21612 Bunion of left foot: Secondary | ICD-10-CM

## 2021-10-21 NOTE — Progress Notes (Signed)
  Subjective:  Patient ID: Kimberly Jennings, female    DOB: 15-Oct-1956,  MRN: [id number]  No chief complaint on file.   65 y.o. female presents with the above complaint. History confirmed with patient.  States that the left big toe and left second toe are continuing to hurt and rub and she cannot wear normal shoes.  She would like to discuss possible surgical intervention for this issue.  Objective:  Physical Exam: warm, good capillary refill, no trophic changes or ulcerative lesions, normal DP and PT pulses and normal sensory exam. Left Foot: bunion deformity noted and hammertoes, with 2nd PIPJ corn medially, mild HPK lateral hallux. HPK submet 4  . POP about the 1st and 2nd toes and 4th met area.   No images are attached to the encounter.  Radiographs: X-ray of the left foot: hallux valgus deformity and digital contractures mild 1st MPJ arthritic changes, prominent bone medially. Assessment:   1. Hallux valgus with bunions, left   2. Hammer toe of left foot   3. Capsulitis of metatarsophalangeal (MTP) joint of left foot    Plan:  Patient was evaluated and treated and all questions answered.  Bunion and Hammertoe -We discussed continued conservative care vs surgical intervention. Patient would like to proceed with intervention -Patient has failed conservative therapy and wishes to proceed with surgical intervention. All risks, benefits, and alternatives discussed with patient. No guarantees given. Consent reviewed and signed by patient. -Planned procedures: Left foot correction of bunion with double osteotomy including Akin, 2nd hammertoe correction 4th met osteotomy  -Should issues persist consider bunionectomy, 2nd HT correction, possible 4th met ostoetomy.  No follow-ups on file.

## 2021-11-08 ENCOUNTER — Telehealth: Payer: Self-pay

## 2021-11-08 NOTE — Telephone Encounter (Signed)
Kimberly Jennings called to cancel her surgery with Dr. March Rummage on 12/29/2021. She stated her mother fell and broke her hip. She will have to go through rehab and [name] is not sure how long she will need to take care of her mom. She will call me back to reschedule later. Notified Dr. March Rummage and left a message for Caren Griffins at [facility]

## 2021-11-08 NOTE — Telephone Encounter (Signed)
Noted thank you

## 2021-11-29 DIAGNOSIS — H02055 Trichiasis without entropian left lower eyelid: Secondary | ICD-10-CM | POA: Diagnosis not present

## 2021-11-29 DIAGNOSIS — H2513 Age-related nuclear cataract, bilateral: Secondary | ICD-10-CM | POA: Diagnosis not present

## 2021-11-29 DIAGNOSIS — H5213 Myopia, bilateral: Secondary | ICD-10-CM | POA: Diagnosis not present

## 2021-11-29 DIAGNOSIS — H524 Presbyopia: Secondary | ICD-10-CM | POA: Diagnosis not present

## 2022-01-06 ENCOUNTER — Encounter: Payer: Medicare Other | Admitting: Podiatry

## 2022-01-20 ENCOUNTER — Encounter: Payer: Medicare Other | Admitting: Podiatry

## 2022-03-31 DIAGNOSIS — E039 Hypothyroidism, unspecified: Secondary | ICD-10-CM | POA: Diagnosis not present

## 2022-03-31 DIAGNOSIS — M8580 Other specified disorders of bone density and structure, unspecified site: Secondary | ICD-10-CM | POA: Diagnosis not present

## 2022-03-31 DIAGNOSIS — E782 Mixed hyperlipidemia: Secondary | ICD-10-CM | POA: Diagnosis not present

## 2022-03-31 DIAGNOSIS — Z0000 Encounter for general adult medical examination without abnormal findings: Secondary | ICD-10-CM | POA: Diagnosis not present

## 2022-04-08 ENCOUNTER — Encounter: Payer: Self-pay | Admitting: Acute Care

## 2022-04-25 DIAGNOSIS — Z87891 Personal history of nicotine dependence: Secondary | ICD-10-CM | POA: Diagnosis not present

## 2022-04-25 DIAGNOSIS — Z122 Encounter for screening for malignant neoplasm of respiratory organs: Secondary | ICD-10-CM | POA: Diagnosis not present

## 2022-04-25 DIAGNOSIS — F17210 Nicotine dependence, cigarettes, uncomplicated: Secondary | ICD-10-CM | POA: Diagnosis not present

## 2022-04-27 ENCOUNTER — Telehealth: Payer: Self-pay | Admitting: Acute Care

## 2022-04-27 DIAGNOSIS — Z122 Encounter for screening for malignant neoplasm of respiratory organs: Secondary | ICD-10-CM

## 2022-04-27 DIAGNOSIS — F17210 Nicotine dependence, cigarettes, uncomplicated: Secondary | ICD-10-CM

## 2022-04-27 DIAGNOSIS — Z87891 Personal history of nicotine dependence: Secondary | ICD-10-CM

## 2022-04-27 NOTE — Telephone Encounter (Signed)
Received faxed report of Lung Screening CT scan done on 04/25/22 at [facility].  ?Called Pt and informed of CT results per Eric Form, NP.  PT verbalized understanding.  Copy of CT sent to PCP.  Order placed for 1 yr f/u CT. ? ?

## 2022-06-21 DIAGNOSIS — E039 Hypothyroidism, unspecified: Secondary | ICD-10-CM | POA: Diagnosis not present

## 2022-06-24 DIAGNOSIS — M8589 Other specified disorders of bone density and structure, multiple sites: Secondary | ICD-10-CM | POA: Diagnosis not present

## 2022-06-24 DIAGNOSIS — Z1231 Encounter for screening mammogram for malignant neoplasm of breast: Secondary | ICD-10-CM | POA: Diagnosis not present

## 2022-12-14 DIAGNOSIS — R051 Acute cough: Secondary | ICD-10-CM | POA: Diagnosis not present

## 2022-12-14 DIAGNOSIS — J209 Acute bronchitis, unspecified: Secondary | ICD-10-CM | POA: Diagnosis not present

## 2022-12-14 DIAGNOSIS — R0981 Nasal congestion: Secondary | ICD-10-CM | POA: Diagnosis not present

## 2022-12-14 DIAGNOSIS — J324 Chronic pansinusitis: Secondary | ICD-10-CM | POA: Diagnosis not present

## 2023-04-12 DIAGNOSIS — I700 Atherosclerosis of aorta: Secondary | ICD-10-CM | POA: Diagnosis not present

## 2023-04-12 DIAGNOSIS — E039 Hypothyroidism, unspecified: Secondary | ICD-10-CM | POA: Diagnosis not present

## 2023-04-12 DIAGNOSIS — E782 Mixed hyperlipidemia: Secondary | ICD-10-CM | POA: Diagnosis not present

## 2023-04-12 DIAGNOSIS — D692 Other nonthrombocytopenic purpura: Secondary | ICD-10-CM | POA: Diagnosis not present

## 2023-04-12 DIAGNOSIS — Z0000 Encounter for general adult medical examination without abnormal findings: Secondary | ICD-10-CM | POA: Diagnosis not present

## 2023-04-12 DIAGNOSIS — F17200 Nicotine dependence, unspecified, uncomplicated: Secondary | ICD-10-CM | POA: Diagnosis not present

## 2023-04-12 DIAGNOSIS — M8580 Other specified disorders of bone density and structure, unspecified site: Secondary | ICD-10-CM | POA: Diagnosis not present

## 2023-04-18 DIAGNOSIS — M158 Other polyosteoarthritis: Secondary | ICD-10-CM | POA: Diagnosis not present

## 2023-04-27 DIAGNOSIS — J432 Centrilobular emphysema: Secondary | ICD-10-CM | POA: Diagnosis not present

## 2023-04-27 DIAGNOSIS — F17210 Nicotine dependence, cigarettes, uncomplicated: Secondary | ICD-10-CM | POA: Diagnosis not present

## 2023-04-27 DIAGNOSIS — I700 Atherosclerosis of aorta: Secondary | ICD-10-CM | POA: Diagnosis not present

## 2023-04-27 DIAGNOSIS — Z122 Encounter for screening for malignant neoplasm of respiratory organs: Secondary | ICD-10-CM | POA: Diagnosis not present

## 2023-04-27 DIAGNOSIS — I2510 Atherosclerotic heart disease of native coronary artery without angina pectoris: Secondary | ICD-10-CM | POA: Diagnosis not present

## 2023-05-08 ENCOUNTER — Telehealth: Payer: Self-pay | Admitting: *Deleted

## 2023-05-08 DIAGNOSIS — F17210 Nicotine dependence, cigarettes, uncomplicated: Secondary | ICD-10-CM

## 2023-05-08 DIAGNOSIS — Z122 Encounter for screening for malignant neoplasm of respiratory organs: Secondary | ICD-10-CM

## 2023-05-08 DIAGNOSIS — Z87891 Personal history of nicotine dependence: Secondary | ICD-10-CM

## 2023-05-08 NOTE — Telephone Encounter (Signed)
Updated patient that we will give her a call tomorrow when results available and reviewed.  Results will be faxed to [place] and we will call her as soon as possible.  Patient acknowledged.

## 2023-05-08 NOTE — Telephone Encounter (Signed)
Calling on lung scan results. Please call her @ [phone]. TY.

## 2023-05-09 NOTE — Telephone Encounter (Signed)
Lung screening CT from 04/27/2023 reviewed in PACS system . Read as a lung RADS 2 .  Spoke with pt and reviewed CT results. Stable lung nodules that will continue to be monitored at yearly scan. Emphysema seen related to smoking history. Coronary calcifications seen. Pt is on statin daily. CT results sent to PCP. Order placed for 1 yr lung screening CT.

## 2023-05-09 NOTE — Addendum Note (Signed)
Addended by: Abigail Miyamoto D on: 05/09/2023 10:02 AM   Modules accepted: Orders

## 2023-05-26 DIAGNOSIS — J9801 Acute bronchospasm: Secondary | ICD-10-CM | POA: Diagnosis not present

## 2023-05-26 DIAGNOSIS — J45909 Unspecified asthma, uncomplicated: Secondary | ICD-10-CM | POA: Diagnosis not present

## 2023-06-20 DIAGNOSIS — J45909 Unspecified asthma, uncomplicated: Secondary | ICD-10-CM | POA: Diagnosis not present

## 2023-07-03 DIAGNOSIS — K080 Exfoliation of teeth due to systemic causes: Secondary | ICD-10-CM | POA: Diagnosis not present

## 2023-07-17 DIAGNOSIS — Z01818 Encounter for other preprocedural examination: Secondary | ICD-10-CM | POA: Diagnosis not present

## 2023-07-17 DIAGNOSIS — Z86010 Personal history of colonic polyps: Secondary | ICD-10-CM | POA: Diagnosis not present

## 2023-08-07 DIAGNOSIS — J439 Emphysema, unspecified: Secondary | ICD-10-CM | POA: Diagnosis not present

## 2023-08-07 DIAGNOSIS — F17200 Nicotine dependence, unspecified, uncomplicated: Secondary | ICD-10-CM | POA: Diagnosis not present

## 2023-08-23 DIAGNOSIS — H524 Presbyopia: Secondary | ICD-10-CM | POA: Diagnosis not present

## 2023-08-29 DIAGNOSIS — T148XXA Other injury of unspecified body region, initial encounter: Secondary | ICD-10-CM | POA: Diagnosis not present

## 2023-08-29 DIAGNOSIS — L089 Local infection of the skin and subcutaneous tissue, unspecified: Secondary | ICD-10-CM | POA: Diagnosis not present

## 2023-09-01 DIAGNOSIS — Z09 Encounter for follow-up examination after completed treatment for conditions other than malignant neoplasm: Secondary | ICD-10-CM | POA: Diagnosis not present

## 2023-09-01 DIAGNOSIS — Z1211 Encounter for screening for malignant neoplasm of colon: Secondary | ICD-10-CM | POA: Diagnosis not present

## 2023-09-01 DIAGNOSIS — Z86010 Personal history of colonic polyps: Secondary | ICD-10-CM | POA: Diagnosis not present

## 2023-09-06 DIAGNOSIS — S81802D Unspecified open wound, left lower leg, subsequent encounter: Secondary | ICD-10-CM | POA: Diagnosis not present

## 2023-10-31 DIAGNOSIS — S63632A Sprain of interphalangeal joint of right middle finger, initial encounter: Secondary | ICD-10-CM | POA: Diagnosis not present

## 2023-11-20 DIAGNOSIS — R519 Headache, unspecified: Secondary | ICD-10-CM | POA: Diagnosis not present

## 2023-11-20 DIAGNOSIS — H9209 Otalgia, unspecified ear: Secondary | ICD-10-CM | POA: Diagnosis not present

## 2023-11-20 DIAGNOSIS — R0981 Nasal congestion: Secondary | ICD-10-CM | POA: Diagnosis not present

## 2023-12-12 DIAGNOSIS — M20011 Mallet finger of right finger(s): Secondary | ICD-10-CM | POA: Diagnosis not present

## 2023-12-29 DIAGNOSIS — J439 Emphysema, unspecified: Secondary | ICD-10-CM | POA: Diagnosis not present

## 2023-12-29 DIAGNOSIS — J441 Chronic obstructive pulmonary disease with (acute) exacerbation: Secondary | ICD-10-CM | POA: Diagnosis not present

## 2023-12-29 DIAGNOSIS — R053 Chronic cough: Secondary | ICD-10-CM | POA: Diagnosis not present

## 2023-12-29 DIAGNOSIS — Z20828 Contact with and (suspected) exposure to other viral communicable diseases: Secondary | ICD-10-CM | POA: Diagnosis not present

## 2024-01-08 DIAGNOSIS — J441 Chronic obstructive pulmonary disease with (acute) exacerbation: Secondary | ICD-10-CM | POA: Diagnosis not present

## 2024-01-26 DIAGNOSIS — S81812A Laceration without foreign body, left lower leg, initial encounter: Secondary | ICD-10-CM | POA: Diagnosis not present

## 2024-01-26 DIAGNOSIS — Z23 Encounter for immunization: Secondary | ICD-10-CM | POA: Diagnosis not present

## 2024-01-26 DIAGNOSIS — W19XXXA Unspecified fall, initial encounter: Secondary | ICD-10-CM | POA: Diagnosis not present

## 2024-01-29 ENCOUNTER — Ambulatory Visit (HOSPITAL_COMMUNITY)
Admission: RE | Admit: 2024-01-29 | Discharge: 2024-01-29 | Disposition: A | Discharge status: Home / Self Care | Payer: Medicare Other | Source: Ambulatory Visit | Attending: Family Medicine | Admitting: Family Medicine

## 2024-01-29 ENCOUNTER — Other Ambulatory Visit (HOSPITAL_COMMUNITY): Payer: Self-pay | Admitting: Family Medicine

## 2024-01-29 DIAGNOSIS — S8990XD Unspecified injury of unspecified lower leg, subsequent encounter: Secondary | ICD-10-CM | POA: Diagnosis not present

## 2024-01-29 DIAGNOSIS — M7989 Other specified soft tissue disorders: Secondary | ICD-10-CM

## 2024-01-29 DIAGNOSIS — S81802D Unspecified open wound, left lower leg, subsequent encounter: Secondary | ICD-10-CM | POA: Diagnosis not present

## 2024-01-29 DIAGNOSIS — S81812D Laceration without foreign body, left lower leg, subsequent encounter: Secondary | ICD-10-CM | POA: Diagnosis not present

## 2024-01-29 DIAGNOSIS — F17200 Nicotine dependence, unspecified, uncomplicated: Secondary | ICD-10-CM | POA: Diagnosis not present

## 2024-01-29 NOTE — Progress Notes (Signed)
Lower extremity venous duplex completed. Please see CV Procedures for preliminary results.  Shona Simpson, RVT 01/29/24 3:29 PM

## 2024-02-20 DIAGNOSIS — I2510 Atherosclerotic heart disease of native coronary artery without angina pectoris: Secondary | ICD-10-CM | POA: Diagnosis not present

## 2024-02-20 DIAGNOSIS — Z1330 Encounter for screening examination for mental health and behavioral disorders, unspecified: Secondary | ICD-10-CM | POA: Diagnosis not present

## 2024-02-20 DIAGNOSIS — S81812A Laceration without foreign body, left lower leg, initial encounter: Secondary | ICD-10-CM | POA: Diagnosis not present

## 2024-02-22 ENCOUNTER — Telehealth: Payer: Self-pay | Admitting: Acute Care

## 2024-02-22 NOTE — Telephone Encounter (Signed)
 Patient needs CT information to be sent to her PCP Kimberly Jennings . Patient can be reached at [phone]

## 2024-02-22 NOTE — Telephone Encounter (Signed)
 Returned call. Annual scan scheduled for 05/07/2024 at [facility] at 8:30am. In the past patient has completed scans at [facility] and results viewed in PACS. Message sent to update Dr. Clelia Croft on annual CT.

## 2024-02-29 DIAGNOSIS — I2510 Atherosclerotic heart disease of native coronary artery without angina pectoris: Secondary | ICD-10-CM | POA: Diagnosis not present

## 2024-02-29 DIAGNOSIS — S81812A Laceration without foreign body, left lower leg, initial encounter: Secondary | ICD-10-CM | POA: Diagnosis not present

## 2024-02-29 DIAGNOSIS — S81812D Laceration without foreign body, left lower leg, subsequent encounter: Secondary | ICD-10-CM | POA: Diagnosis not present

## 2024-02-29 DIAGNOSIS — Z1330 Encounter for screening examination for mental health and behavioral disorders, unspecified: Secondary | ICD-10-CM | POA: Diagnosis not present

## 2024-03-07 DIAGNOSIS — S81812D Laceration without foreign body, left lower leg, subsequent encounter: Secondary | ICD-10-CM | POA: Diagnosis not present

## 2024-03-07 DIAGNOSIS — I2510 Atherosclerotic heart disease of native coronary artery without angina pectoris: Secondary | ICD-10-CM | POA: Diagnosis not present

## 2024-03-07 DIAGNOSIS — S81812A Laceration without foreign body, left lower leg, initial encounter: Secondary | ICD-10-CM | POA: Diagnosis not present

## 2024-03-07 DIAGNOSIS — Z1330 Encounter for screening examination for mental health and behavioral disorders, unspecified: Secondary | ICD-10-CM | POA: Diagnosis not present

## 2024-03-14 DIAGNOSIS — Z1330 Encounter for screening examination for mental health and behavioral disorders, unspecified: Secondary | ICD-10-CM | POA: Diagnosis not present

## 2024-03-14 DIAGNOSIS — S81812D Laceration without foreign body, left lower leg, subsequent encounter: Secondary | ICD-10-CM | POA: Diagnosis not present

## 2024-03-14 DIAGNOSIS — S81812A Laceration without foreign body, left lower leg, initial encounter: Secondary | ICD-10-CM | POA: Diagnosis not present

## 2024-03-14 DIAGNOSIS — I2510 Atherosclerotic heart disease of native coronary artery without angina pectoris: Secondary | ICD-10-CM | POA: Diagnosis not present

## 2024-03-19 ENCOUNTER — Encounter (HOSPITAL_BASED_OUTPATIENT_CLINIC_OR_DEPARTMENT_OTHER): Payer: Medicare Other | Admitting: Internal Medicine

## 2024-03-21 DIAGNOSIS — S81812A Laceration without foreign body, left lower leg, initial encounter: Secondary | ICD-10-CM | POA: Diagnosis not present

## 2024-03-21 DIAGNOSIS — S81812D Laceration without foreign body, left lower leg, subsequent encounter: Secondary | ICD-10-CM | POA: Diagnosis not present

## 2024-03-21 DIAGNOSIS — Z1330 Encounter for screening examination for mental health and behavioral disorders, unspecified: Secondary | ICD-10-CM | POA: Diagnosis not present

## 2024-03-21 DIAGNOSIS — I2510 Atherosclerotic heart disease of native coronary artery without angina pectoris: Secondary | ICD-10-CM | POA: Diagnosis not present

## 2024-04-18 ENCOUNTER — Other Ambulatory Visit (HOSPITAL_BASED_OUTPATIENT_CLINIC_OR_DEPARTMENT_OTHER): Payer: Self-pay | Admitting: Family Medicine

## 2024-04-18 DIAGNOSIS — Z0000 Encounter for general adult medical examination without abnormal findings: Secondary | ICD-10-CM | POA: Diagnosis not present

## 2024-04-18 DIAGNOSIS — E782 Mixed hyperlipidemia: Secondary | ICD-10-CM | POA: Diagnosis not present

## 2024-04-18 DIAGNOSIS — Z1331 Encounter for screening for depression: Secondary | ICD-10-CM | POA: Diagnosis not present

## 2024-04-18 DIAGNOSIS — J45909 Unspecified asthma, uncomplicated: Secondary | ICD-10-CM | POA: Diagnosis not present

## 2024-04-18 DIAGNOSIS — M8580 Other specified disorders of bone density and structure, unspecified site: Secondary | ICD-10-CM

## 2024-04-18 DIAGNOSIS — Z1231 Encounter for screening mammogram for malignant neoplasm of breast: Secondary | ICD-10-CM

## 2024-04-18 DIAGNOSIS — E039 Hypothyroidism, unspecified: Secondary | ICD-10-CM | POA: Diagnosis not present

## 2024-05-07 ENCOUNTER — Ambulatory Visit (HOSPITAL_BASED_OUTPATIENT_CLINIC_OR_DEPARTMENT_OTHER)
Admission: RE | Admit: 2024-05-07 | Discharge: 2024-05-07 | Disposition: A | Discharge status: Home / Self Care | Source: Ambulatory Visit | Attending: Family Medicine | Admitting: Family Medicine

## 2024-05-07 ENCOUNTER — Inpatient Hospital Stay (HOSPITAL_BASED_OUTPATIENT_CLINIC_OR_DEPARTMENT_OTHER): Admission: RE | Admit: 2024-05-07 | Source: Ambulatory Visit | Admitting: Radiology

## 2024-05-07 ENCOUNTER — Encounter (HOSPITAL_BASED_OUTPATIENT_CLINIC_OR_DEPARTMENT_OTHER): Payer: Self-pay | Admitting: Radiology

## 2024-05-07 ENCOUNTER — Ambulatory Visit (INDEPENDENT_AMBULATORY_CARE_PROVIDER_SITE_OTHER)
Admission: RE | Admit: 2024-05-07 | Discharge: 2024-05-07 | Disposition: A | Discharge status: Home / Self Care | Source: Ambulatory Visit | Attending: Acute Care | Admitting: Acute Care

## 2024-05-07 ENCOUNTER — Inpatient Hospital Stay (HOSPITAL_BASED_OUTPATIENT_CLINIC_OR_DEPARTMENT_OTHER): Admission: RE | Admit: 2024-05-07 | Payer: Medicare Other | Source: Ambulatory Visit | Admitting: Radiology

## 2024-05-07 DIAGNOSIS — Z1231 Encounter for screening mammogram for malignant neoplasm of breast: Secondary | ICD-10-CM

## 2024-05-07 DIAGNOSIS — F17210 Nicotine dependence, cigarettes, uncomplicated: Secondary | ICD-10-CM

## 2024-05-07 DIAGNOSIS — Z122 Encounter for screening for malignant neoplasm of respiratory organs: Secondary | ICD-10-CM | POA: Diagnosis not present

## 2024-05-07 DIAGNOSIS — Z87891 Personal history of nicotine dependence: Secondary | ICD-10-CM

## 2024-05-28 ENCOUNTER — Other Ambulatory Visit: Payer: Self-pay

## 2024-05-28 DIAGNOSIS — Z122 Encounter for screening for malignant neoplasm of respiratory organs: Secondary | ICD-10-CM

## 2024-05-28 DIAGNOSIS — Z87891 Personal history of nicotine dependence: Secondary | ICD-10-CM

## 2024-05-29 DIAGNOSIS — S81819A Laceration without foreign body, unspecified lower leg, initial encounter: Secondary | ICD-10-CM | POA: Diagnosis not present

## 2024-06-07 DIAGNOSIS — L089 Local infection of the skin and subcutaneous tissue, unspecified: Secondary | ICD-10-CM | POA: Diagnosis not present

## 2024-07-02 ENCOUNTER — Ambulatory Visit (INDEPENDENT_AMBULATORY_CARE_PROVIDER_SITE_OTHER)
Admission: RE | Admit: 2024-07-02 | Discharge: 2024-07-02 | Disposition: A | Discharge status: Home / Self Care | Source: Ambulatory Visit | Attending: Family Medicine | Admitting: Family Medicine

## 2024-07-02 DIAGNOSIS — M8589 Other specified disorders of bone density and structure, multiple sites: Secondary | ICD-10-CM | POA: Diagnosis not present

## 2024-07-02 DIAGNOSIS — M8580 Other specified disorders of bone density and structure, unspecified site: Secondary | ICD-10-CM

## 2024-07-02 DIAGNOSIS — Z780 Asymptomatic menopausal state: Secondary | ICD-10-CM

## 2024-12-04 DIAGNOSIS — K080 Exfoliation of teeth due to systemic causes: Secondary | ICD-10-CM | POA: Diagnosis not present
